# Patient Record
Sex: Female | Born: 1950 | Race: White | Hispanic: No | Marital: Married
Health system: Southern US, Community
[De-identification: ages and names within clinical notes are randomized; demographics above are authoritative.]

---

## 2020-01-14 ENCOUNTER — Emergency Department
Admission: EM | Admit: 2020-01-14 | Discharge: 2020-01-14 | Disposition: A | Discharge status: Home / Self Care | Payer: No Typology Code available for payment source | Attending: Emergency Medicine | Admitting: Emergency Medicine

## 2020-01-14 ENCOUNTER — Other Ambulatory Visit: Payer: Self-pay

## 2020-01-14 ENCOUNTER — Emergency Department: Payer: No Typology Code available for payment source

## 2020-01-14 DIAGNOSIS — Y92410 Unspecified street and highway as the place of occurrence of the external cause: Secondary | ICD-10-CM | POA: Diagnosis not present

## 2020-01-14 DIAGNOSIS — Y939 Activity, unspecified: Secondary | ICD-10-CM | POA: Insufficient documentation

## 2020-01-14 DIAGNOSIS — M542 Cervicalgia: Secondary | ICD-10-CM | POA: Insufficient documentation

## 2020-01-14 DIAGNOSIS — M549 Dorsalgia, unspecified: Secondary | ICD-10-CM | POA: Insufficient documentation

## 2020-01-14 DIAGNOSIS — R519 Headache, unspecified: Secondary | ICD-10-CM | POA: Diagnosis not present

## 2020-01-14 DIAGNOSIS — Y999 Unspecified external cause status: Secondary | ICD-10-CM | POA: Diagnosis not present
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
